# Patient Record
Sex: Male | Born: 1965 | Race: Black or African American | Hispanic: No | Marital: Married | Smoking: Current every day smoker
Health system: Southern US, Community
[De-identification: ages and names within clinical notes are randomized; demographics above are authoritative.]

---

## 2016-02-05 ENCOUNTER — Emergency Department
Admission: EM | Admit: 2016-02-05 | Discharge: 2016-02-05 | Disposition: A | Discharge status: Home / Self Care | Payer: Managed Care, Other (non HMO) | Attending: Emergency Medicine | Admitting: Emergency Medicine

## 2016-02-05 ENCOUNTER — Emergency Department: Payer: Managed Care, Other (non HMO)

## 2016-02-05 DIAGNOSIS — J069 Acute upper respiratory infection, unspecified: Secondary | ICD-10-CM | POA: Diagnosis not present

## 2016-02-05 DIAGNOSIS — J029 Acute pharyngitis, unspecified: Secondary | ICD-10-CM | POA: Diagnosis present

## 2016-02-05 LAB — RAPID INFLUENZA A&B ANTIGENS: Influenza B (ARMC): NEGATIVE

## 2016-02-05 LAB — RAPID INFLUENZA A&B ANTIGENS (ARMC ONLY): INFLUENZA A (ARMC): NEGATIVE

## 2016-02-05 MED ORDER — AZITHROMYCIN 250 MG PO TABS: ORAL_TABLET | ORAL | Status: DC

## 2016-02-05 MED ORDER — IBUPROFEN 800 MG PO TABS
800.0000 mg | ORAL_TABLET | Freq: Once | ORAL | Status: AC
  Administered 2016-02-05: 800 mg via ORAL
  Filled 2016-02-05: qty 1

## 2016-02-05 MED ORDER — AZITHROMYCIN 500 MG PO TABS
500.0000 mg | ORAL_TABLET | Freq: Once | ORAL | Status: AC
  Administered 2016-02-05: 500 mg via ORAL
  Filled 2016-02-05: qty 1

## 2016-02-05 MED ORDER — GUAIFENESIN-CODEINE 100-10 MG/5ML PO SOLN
10.0000 mL | Freq: Once | ORAL | Status: AC
  Administered 2016-02-05: 10 mL via ORAL
  Filled 2016-02-05: qty 10

## 2016-02-05 MED ORDER — HYDROCODONE-ACETAMINOPHEN 5-325 MG PO TABS
2.0000 | ORAL_TABLET | Freq: Once | ORAL | Status: AC
Start: 2016-02-05 — End: 2016-02-05
  Administered 2016-02-05: 2 via ORAL
  Filled 2016-02-05: qty 2

## 2016-02-05 MED ORDER — IBUPROFEN 800 MG PO TABS: 800.0000 mg | ORAL_TABLET | Freq: Three times a day (TID) | ORAL | Status: AC | PRN

## 2016-02-05 MED ORDER — GUAIFENESIN-CODEINE 100-10 MG/5ML PO SOLN: 10.0000 mL | ORAL | Status: DC | PRN

## 2016-02-05 NOTE — ED Notes (Signed)
Pt states started with vomiting and diarrhea on Thursday and thought he had a 24 hr bug, and it progressed into cough and sore throat. Pt states has been taking Mucinex with no relief.

## 2016-02-05 NOTE — Discharge Instructions (Signed)
Upper Respiratory Infection, Adult Most upper respiratory infections (URIs) are a viral infection of the air passages leading to the lungs. A URI affects the nose, throat, and upper air passages. The most common type of URI is nasopharyngitis and is typically referred to as "the common cold." URIs run their course and usually go away on their own. Most of the time, a URI does not require medical attention, but sometimes a bacterial infection in the upper airways can follow a viral infection. This is called a secondary infection. Sinus and middle ear infections are common types of secondary upper respiratory infections. Bacterial pneumonia can also complicate a URI. A URI can worsen asthma and chronic obstructive pulmonary disease (COPD). Sometimes, these complications can require emergency medical care and may be life threatening.  CAUSES Almost all URIs are caused by viruses. A virus is a type of germ and can spread from one person to another.  RISKS FACTORS You may be at risk for a URI if:   You smoke.   You have chronic heart or lung disease.  You have a weakened defense (immune) system.   You are very young or very old.   You have nasal allergies or asthma.  You work in crowded or poorly ventilated areas.  You work in health care facilities or schools. SIGNS AND SYMPTOMS  Symptoms typically develop 2-3 days after you come in contact with a cold virus. Most viral URIs last 7-10 days. However, viral URIs from the influenza virus (flu virus) can last 14-18 days and are typically more severe. Symptoms may include:   Runny or stuffy (congested) nose.   Sneezing.   Cough.   Sore throat.   Headache.   Fatigue.   Fever.   Loss of appetite.   Pain in your forehead, behind your eyes, and over your cheekbones (sinus pain).  Muscle aches.  DIAGNOSIS  Your health care provider may diagnose a URI by:  Physical exam.  Tests to check that your symptoms are not due to  another condition such as:  Strep throat.  Sinusitis.  Pneumonia.  Asthma. TREATMENT  A URI goes away on its own with time. It cannot be cured with medicines, but medicines may be prescribed or recommended to relieve symptoms. Medicines may help:  Reduce your fever.  Reduce your cough.  Relieve nasal congestion. HOME CARE INSTRUCTIONS   Take medicines only as directed by your health care provider.   Gargle warm saltwater or take cough drops to comfort your throat as directed by your health care provider.  Use a warm mist humidifier or inhale steam from a shower to increase air moisture. This may make it easier to breathe.  Drink enough fluid to keep your urine clear or pale yellow.   Eat soups and other clear broths and maintain good nutrition.   Rest as needed.   Return to work when your temperature has returned to normal or as your health care provider advises. You may need to stay home longer to avoid infecting others. You can also use a face mask and careful hand washing to prevent spread of the virus.  Increase the usage of your inhaler if you have asthma.   Do not use any tobacco products, including cigarettes, chewing tobacco, or electronic cigarettes. If you need help quitting, ask your health care provider. PREVENTION  The best way to protect yourself from getting a cold is to practice good hygiene.   Avoid oral or hand contact with people with cold   symptoms.   Wash your hands often if contact occurs.  There is no clear evidence that vitamin C, vitamin E, echinacea, or exercise reduces the chance of developing a cold. However, it is always recommended to get plenty of rest, exercise, and practice good nutrition.  SEEK MEDICAL CARE IF:   You are getting worse rather than better.   Your symptoms are not controlled by medicine.   You have chills.  You have worsening shortness of breath.  You have brown or red mucus.  You have yellow or brown nasal  discharge.  You have pain in your face, especially when you bend forward.  You have a fever.  You have swollen neck glands.  You have pain while swallowing.  You have white areas in the back of your throat. SEEK IMMEDIATE MEDICAL CARE IF:   You have severe or persistent:  Headache.  Ear pain.  Sinus pain.  Chest pain.  You have chronic lung disease and any of the following:  Wheezing.  Prolonged cough.  Coughing up blood.  A change in your usual mucus.  You have a stiff neck.  You have changes in your:  Vision.  Hearing.  Thinking.  Mood. MAKE SURE YOU:   Understand these instructions.  Will watch your condition.  Will get help right away if you are not doing well or get worse.   This information is not intended to replace advice given to you by your health care provider. Make sure you discuss any questions you have with your health care provider.   Document Released: 05/15/2001 Document Revised: 04/05/2015 Document Reviewed: 02/24/2014 Elsevier Interactive Patient Education 2016 [employer]  

## 2016-02-05 NOTE — ED Provider Notes (Signed)
[facility] Emergency Department Provider Note  ____________________________________________  Time seen: Approximately 6:50 PM  I have reviewed the triage vital signs and the nursing notes.   HISTORY  Chief Complaint Cough and Sore Throat    HPI Albert Reese is a 50 y.o. male presents for evaluation of cough congestion sore throat drainage body aches. Patient states started off with vomiting diarrhea last week and then progressed into the current symptoms. States he took Motrin prior to arrival. Has been having a fever on and off. Progressive changes. Nothing seems to make it better or worse this tried over-the-counter medications with no relief.   No past medical history on file.  There are no active problems to display for this patient.   No past surgical history on file.  Current Outpatient Rx  Name  Route  Sig  Dispense  Refill  . azithromycin (ZITHROMAX Z-PAK) 250 MG tablet      Take 2 tablets (500 mg) on  Day 1,  followed by 1 tablet (250 mg) once daily on Days 2 through 5.   6 each   0   . guaiFENesin-codeine 100-10 MG/5ML syrup   Oral   Take 10 mLs by mouth every 4 (four) hours as needed for cough.   180 mL   0   . ibuprofen (ADVIL,MOTRIN) 800 MG tablet   Oral   Take 1 tablet (800 mg total) by mouth every 8 (eight) hours as needed.   30 tablet   0     Allergies Review of patient's allergies indicates no known allergies.  No family history on file.  Social History Social History  Substance Use Topics  . Smoking status: Not on file  . Smokeless tobacco: Not on file  . Alcohol Use: Not on file    Review of Systems Constitutional: Positive fever chills Eyes: No visual changes. ENT: Positive sore throat. Positive nasal congestion. Cardiovascular: Denies chest pain. Respiratory: Denies shortness of breath. Musculoskeletal: Positive for generalized muscle aches. Skin: Negative for rash. Neurological: Negative for headaches,  focal weakness or numbness.  10-point ROS otherwise negative.  ____________________________________________   PHYSICAL EXAM:  VITAL SIGNS: ED Triage Vitals  Enc Vitals Group     BP 02/05/16 1755 144/68 mmHg     Pulse Rate 02/05/16 1755 70     Resp 02/05/16 1755 18     Temp 02/05/16 1755 98.4 F (36.9 C)     Temp src --      SpO2 02/05/16 1755 97 %     Weight 02/05/16 1755 190 lb (86.183 kg)     Height 02/05/16 1755 5\' 9"  (1.753 m)     Head Cir --      Peak Flow --      Pain Score 02/05/16 1756 7     Pain Loc --      Pain Edu? --      Excl. in GC? --     Constitutional: Alert and oriented. Well appearing and in no acute distress. Head: Atraumatic. Nose: Positive congestion/rhinnorhea. Mouth/Throat: Mucous membranes are moist.  Oropharynx non-erythematous. Neck: No stridor.  Full range of motion nontender Cardiovascular: Normal rate, regular rhythm. Grossly normal heart sounds.  Good peripheral circulation. Respiratory: Normal respiratory effort.  No retractions. Lungs CTAB. No active wheezing rhonchi noted Musculoskeletal: No lower extremity tenderness nor edema.  No joint effusions. Neurologic:  Normal speech and language. No gross focal neurologic deficits are appreciated. No gait instability. Skin:  Skin is warm, dry and  intact. No rash noted. Psychiatric: Mood and affect are normal. Speech and behavior are normal.  ____________________________________________   LABS (all labs ordered are listed, but only abnormal results are displayed)  Labs Reviewed  RAPID INFLUENZA A&B ANTIGENS ([facility] ONLY)   ____________________________________________   RADIOLOGY  No acute cardiopulmonary findings ____________________________________________   PROCEDURES  Procedure(s) performed: None  Critical Care performed: No  ____________________________________________   INITIAL IMPRESSION / ASSESSMENT AND PLAN / ED COURSE  Pertinent labs & imaging results that were  available during my care of the patient were reviewed by me and considered in my medical decision making (see chart for details).  Acute upper respiratory infection. Rx given for Zithromax, Robitussin-AC, Motrin 800 mg 3 times a day. Patient follow-up with his PCP or return to the ER with any worsening symptomology. Work excuse 48 hours given. ____________________________________________   FINAL CLINICAL IMPRESSION(S) / ED DIAGNOSES  Final diagnoses:  URI, acute     This chart was dictated using voice recognition software/Dragon. Despite best efforts to proofread, errors can occur which can change the meaning. Any change was purely unintentional.   Evangeline Dakin, PA-C 02/05/16 2033  Phineas Semen, MD 02/07/16 [phone]

## 2016-02-05 NOTE — ED Notes (Signed)
Pt reports 2-3 days of n/v/d. Pt reports that has resolved and now his throat hurts and he has a cough.

## 2016-04-24 ENCOUNTER — Encounter: Payer: Self-pay | Admitting: *Deleted

## 2016-04-24 ENCOUNTER — Emergency Department
Admission: EM | Admit: 2016-04-24 | Discharge: 2016-04-24 | Disposition: A | Discharge status: Home / Self Care | Payer: Worker's Compensation | Attending: Emergency Medicine | Admitting: Emergency Medicine

## 2016-04-24 ENCOUNTER — Emergency Department: Payer: Worker's Compensation

## 2016-04-24 DIAGNOSIS — Y9389 Activity, other specified: Secondary | ICD-10-CM | POA: Insufficient documentation

## 2016-04-24 DIAGNOSIS — Y929 Unspecified place or not applicable: Secondary | ICD-10-CM | POA: Insufficient documentation

## 2016-04-24 DIAGNOSIS — Y999 Unspecified external cause status: Secondary | ICD-10-CM | POA: Insufficient documentation

## 2016-04-24 DIAGNOSIS — R42 Dizziness and giddiness: Secondary | ICD-10-CM | POA: Insufficient documentation

## 2016-04-24 DIAGNOSIS — M50321 Other cervical disc degeneration at C4-C5 level: Secondary | ICD-10-CM | POA: Insufficient documentation

## 2016-04-24 DIAGNOSIS — F17210 Nicotine dependence, cigarettes, uncomplicated: Secondary | ICD-10-CM | POA: Insufficient documentation

## 2016-04-24 DIAGNOSIS — M5090 Cervical disc disorder, unspecified, unspecified cervical region: Secondary | ICD-10-CM

## 2016-04-24 DIAGNOSIS — M5031 Other cervical disc degeneration,  high cervical region: Secondary | ICD-10-CM | POA: Insufficient documentation

## 2016-04-24 MED ORDER — PREDNISONE 10 MG PO TABS: 50.0000 mg | ORAL_TABLET | Freq: Every day | ORAL | Status: AC

## 2016-04-24 MED ORDER — HYDROCODONE-ACETAMINOPHEN 5-325 MG PO TABS: 1.0000 | ORAL_TABLET | ORAL | Status: AC | PRN

## 2016-04-24 MED ORDER — BACLOFEN 10 MG PO TABS: 10.0000 mg | ORAL_TABLET | Freq: Three times a day (TID) | ORAL | Status: AC

## 2016-04-24 NOTE — ED Notes (Signed)
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

## 2016-04-24 NOTE — ED Provider Notes (Signed)
[facility] Emergency Department Provider Note  ____________________________________________  Time seen: Approximately 10:19 AM  I have reviewed the triage vital signs and the nursing notes.   HISTORY  Chief Complaint Fall    HPI Albert Reese is a 50 y.o. male presents for evaluation of continued cervical pain mild head dizziness and left arm weakness secondary to fall off a tractor last week. Patient states he was seen in the emergency room on Saturday which was 3 days ago with neck pain or arm pain had radiological imaging done unknown which type and sent home. Patient states that he still continues to have left arm weakness and numbness.   History reviewed. No pertinent past medical history.  There are no active problems to display for this patient.   History reviewed. No pertinent past surgical history.  Current Outpatient Rx  Name  Route  Sig  Dispense  Refill  . baclofen (LIORESAL) 10 MG tablet   Oral   Take 1 tablet (10 mg total) by mouth 3 (three) times daily.   30 tablet   0   . HYDROcodone-acetaminophen (NORCO) 5-325 MG tablet   Oral   Take 1-2 tablets by mouth every 4 (four) hours as needed for moderate pain.   15 tablet   0   . ibuprofen (ADVIL,MOTRIN) 800 MG tablet   Oral   Take 1 tablet (800 mg total) by mouth every 8 (eight) hours as needed.   30 tablet   0   . predniSONE (DELTASONE) 10 MG tablet   Oral   Take 5 tablets (50 mg total) by mouth daily with breakfast.   25 tablet   0     Allergies Review of patient's allergies indicates no known allergies.  No family history on file.  Social History Social History  Substance Use Topics  . Smoking status: Current Every Day Smoker -- 0.50 packs/day    Types: Cigarettes  . Smokeless tobacco: None  . Alcohol Use: No    Review of Systems Constitutional: No fever/chills Eyes: No visual changes. ENT: No sore throat. Cardiovascular: Denies chest pain. Respiratory:  Denies shortness of breath. Musculoskeletal: Positive for cervical pain, left shoulder and arm weakness. Skin: Negative for rash. Neurological: Positive headache and left arm numbness  10-point ROS otherwise negative.  ____________________________________________   PHYSICAL EXAM:  VITAL SIGNS: ED Triage Vitals  Enc Vitals Group     BP 04/24/16 0958 136/81 mmHg     Pulse Rate 04/24/16 0958 71     Resp 04/24/16 0958 20     Temp 04/24/16 0958 97.7 F (36.5 C)     Temp Source 04/24/16 0958 Oral     SpO2 04/24/16 0958 98 %     Weight 04/24/16 0958 190 lb (86.183 kg)     Height 04/24/16 0958 5\' 9"  (1.753 m)     Head Cir --      Peak Flow --      Pain Score 04/24/16 0959 8     Pain Loc --      Pain Edu? --      Excl. in GC? --     Constitutional: Alert and oriented. Well appearing and in no acute distress. Mouth/Throat: Mucous membranes are moist.  Oropharynx non-erythematous. Neck: No stridor.  Full range of motion with point tenderness noted to the C5-7 area more paraspinally. Cardiovascular: Normal rate, regular rhythm. Grossly normal heart sounds.  Good peripheral circulation. Respiratory: Normal respiratory effort.  No retractions. Lungs CTAB. Musculoskeletal: No  lower extremity tenderness nor edema.  No joint effusions. Her 6 extremities with good extension and abduction and abduction. Strength intact equally bilaterally. Distal neurovascular reflexes as well as pulses. Neurologic:  Normal speech and language. No gross focal neurologic deficits are appreciated. No gait instability. Skin:  Skin is warm, dry and intact. No rash noted. Psychiatric: Mood and affect are normal. Speech and behavior are normal.  ____________________________________________   LABS (all labs ordered are listed, but only abnormal results are displayed)  Labs Reviewed - No data to display ____________________________________________   RADIOLOGY  IMPRESSION: 1. No significant intracranial  abnormality observed. 2. Cervical spondylosis and degenerative disc disease causing moderate impingement at C4-5 and potentially mild impingement at C3-4, as detailed above. ____________________________________________   PROCEDURES  Procedure(s) performed: None  Critical Care performed: No  ____________________________________________   INITIAL IMPRESSION / ASSESSMENT AND PLAN / ED COURSE  Pertinent labs & imaging results that were available during my care of the patient were reviewed by me and considered in my medical decision making (see chart for details).  Degenerative disc diseases cervical with possible impingement noted. She was given referral to [facility] in [place] Rx given for baclofen 10 mg 3 times a day and prednisone 5 day taper. Patient follow-up with PCP or return to ER with any worsening symptomology. ____________________________________________   FINAL CLINICAL IMPRESSION(S) / ED DIAGNOSES  Final diagnoses:  Cervical back pain with evidence of disc disease     This chart was dictated using voice recognition software/Dragon. Despite best efforts to proofread, errors can occur which can change the meaning. Any change was purely unintentional.   Evangeline Dakin, PA-C 04/24/16 1220  Jene Every, MD 04/24/16 [phone]

## 2016-04-24 NOTE — ED Notes (Signed)
See triage note  States he is still having some dizziness and some numbness to left arm and neck

## 2017-06-20 IMAGING — CT CT CERVICAL SPINE W/O CM
4 of 5 series · 14 of 33 positions shown, 16 images · non-contrast
Comparison: None.

CLINICAL DATA: Dizziness. Numbness to the left arm and neck. Fall
from tractor 3 days ago.

EXAM:
CT HEAD WITHOUT CONTRAST
CT CERVICAL SPINE WITHOUT CONTRAST
TECHNIQUE: Multidetector CT imaging of the head and cervical spine was
performed following the standard protocol without intravenous
contrast. Multiplanar CT image reconstructions of the cervical spine
were also generated.

[Series 5: soft tissue · axial · 0.33mm/px · z∈[+380,+418]mm · 2 of 98 slices shown]
[im 20/98  soft-tissue]
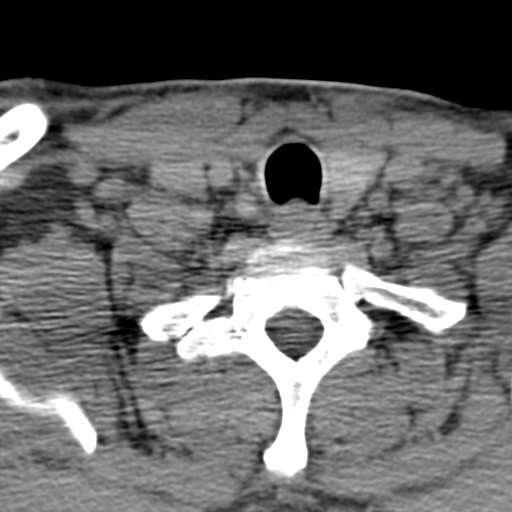
[im 39/98  soft-tissue]
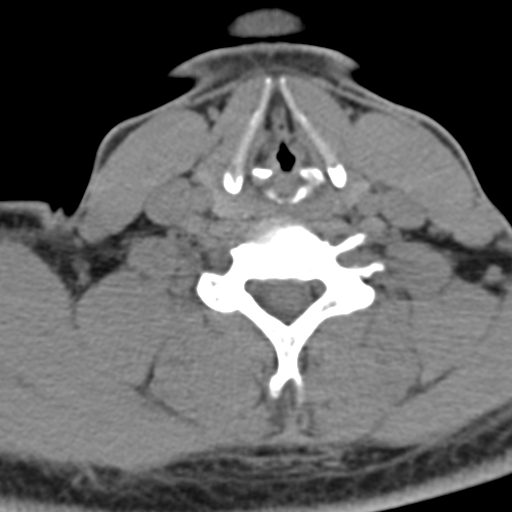

[Series 6: sagittal bone · sagittal · 0.30mm/px · 5 of 52 slices shown, 6 images]
[im 18/52  bone]
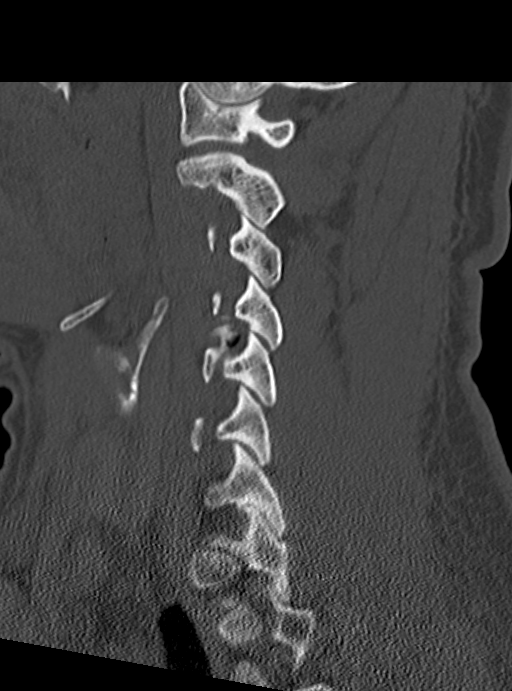
[im 22/52  bone]
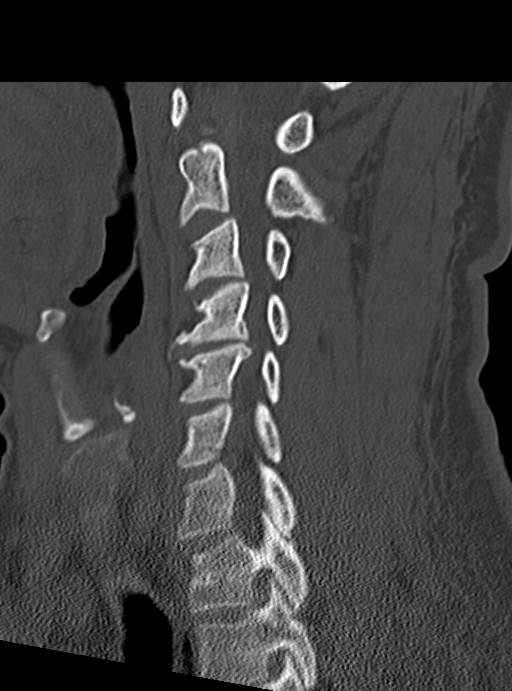
[im 26/52  soft-tissue]
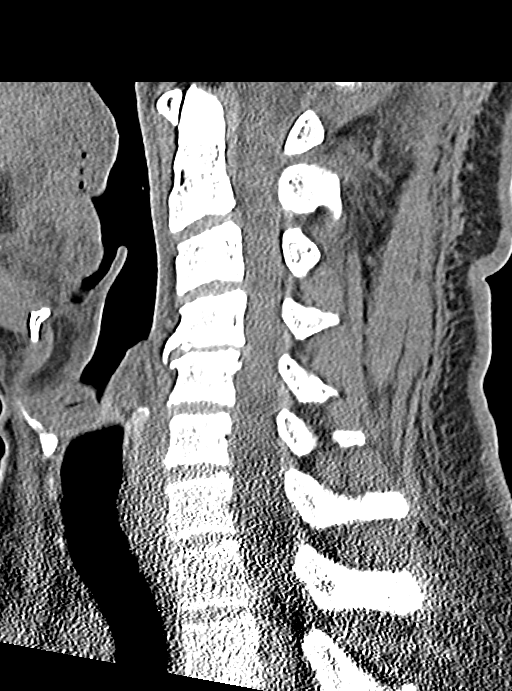
[im 26/52  bone]
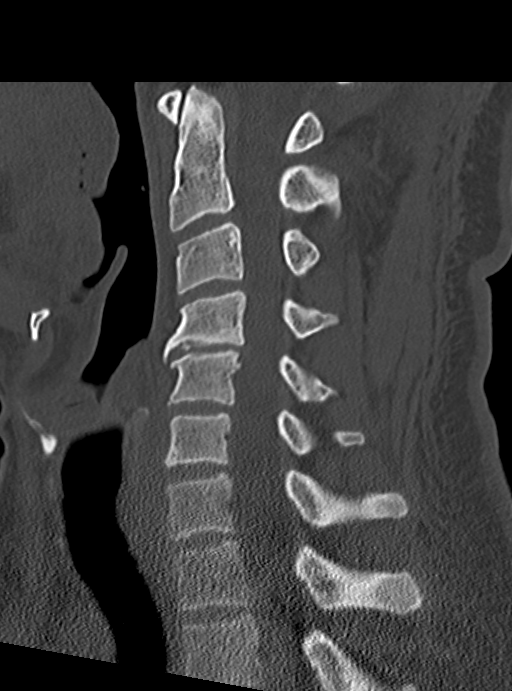
[im 30/52  bone]
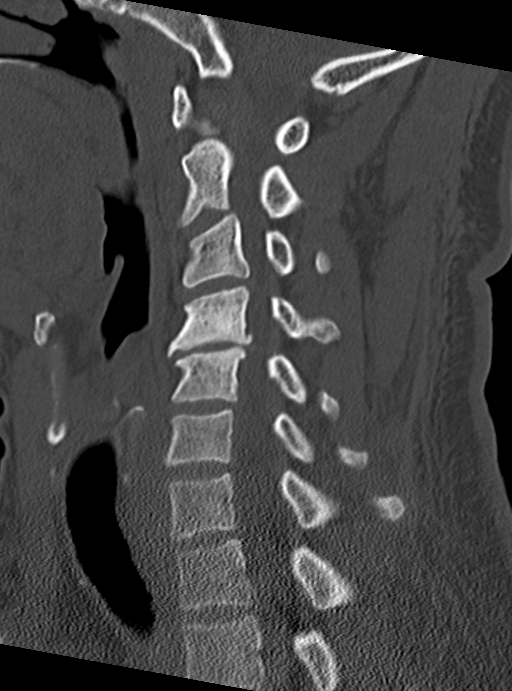
[im 35/52  bone]
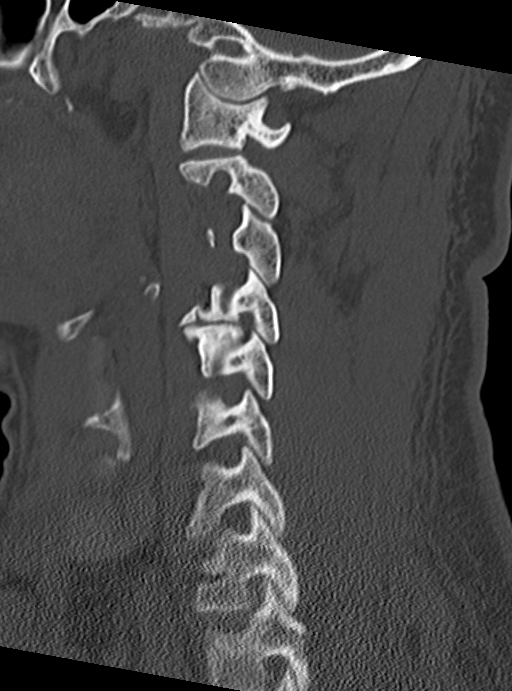

[Series 7: coronal bone · coronal · 0.24mm/px · 3 of 67 slices shown]
[im 14/67  bone]
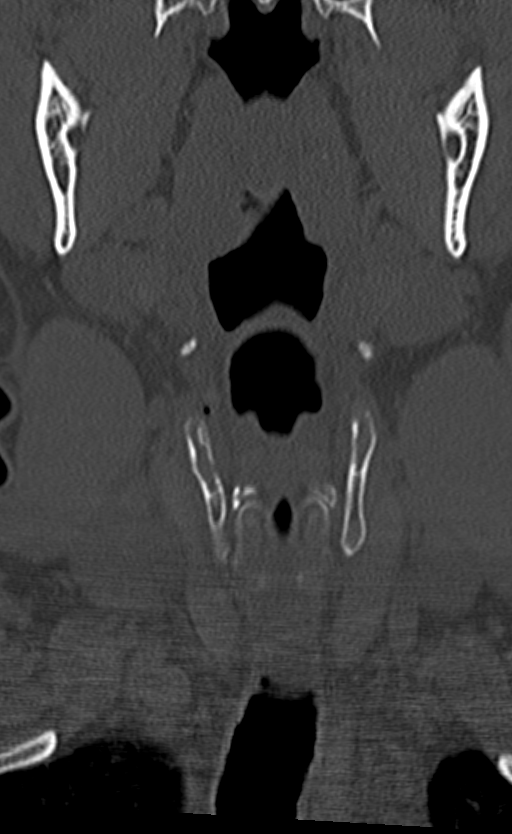
[im 27/67  bone]
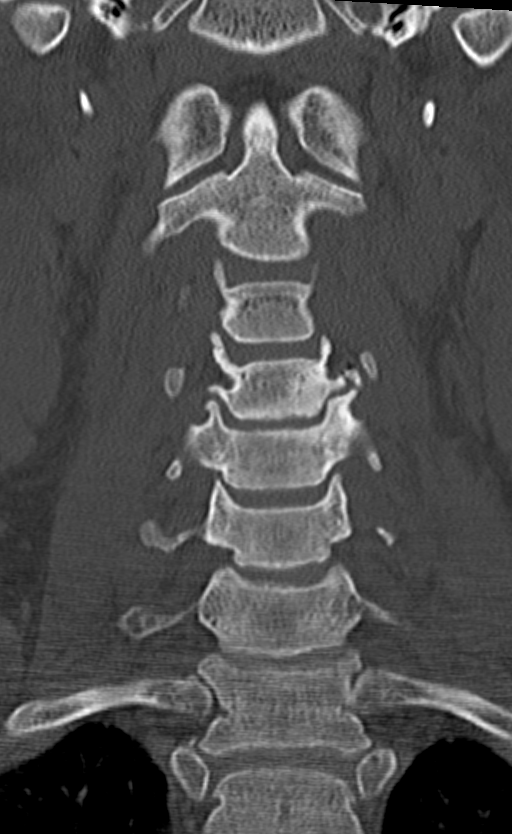
[im 40/67  bone]
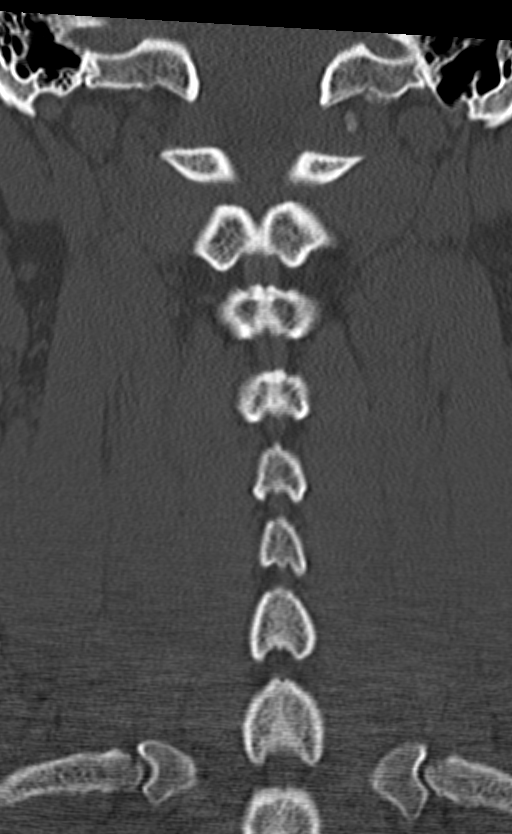

[Series 8: axial · axial · 0.26mm/px · z∈[+366,+476]mm · 4 of 97 slices shown, 5 images]
[im 20/97  soft-tissue]
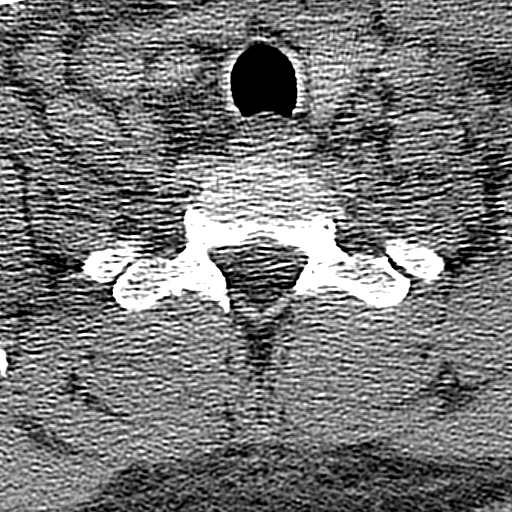
[im 20/97  bone]
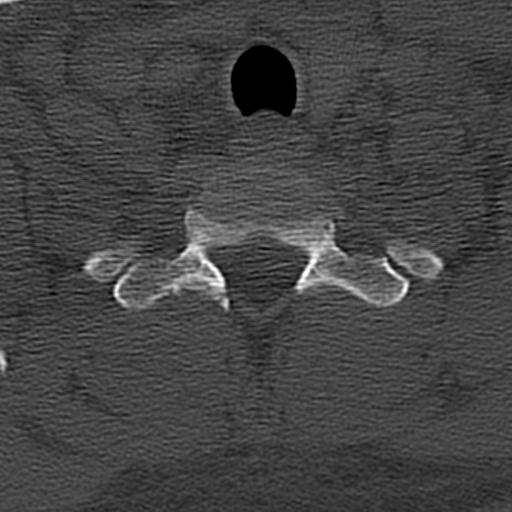
[im 39/97  bone]
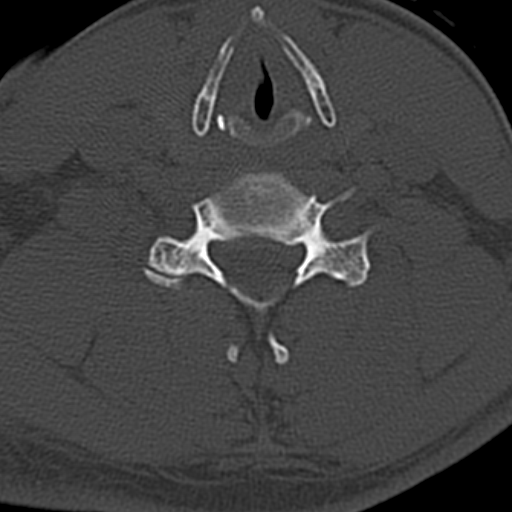
[im 58/97  bone]
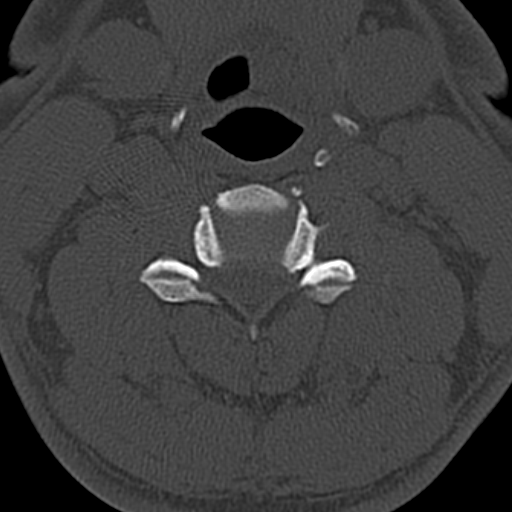
[im 77/97  bone]
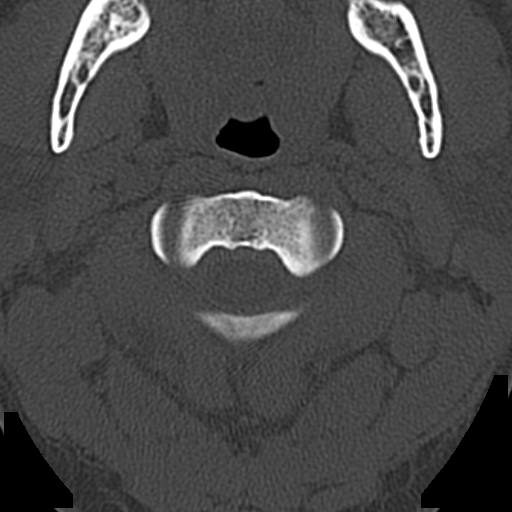

[14 of 33 positions shown; findings below may reference images not displayed]

FINDINGS: CT HEAD FINDINGS

The brainstem, cerebellum, cerebral peduncles, thalami, basal
ganglia, basilar cisterns, and ventricular system appear within
normal limits. No intracranial hemorrhage, mass lesion, or acute
CVA.

CT CERVICAL SPINE FINDINGS

No cervical spine fracture or subluxation identified.

Moderate central narrowing of the thecal sac and mild to moderate
bilateral foraminal stenosis at C4-5 due to disc bulge, posterior
osseous ridging, and uncinate spurring. Possible mild central
narrowing of the thecal sac at C3-4 due to disc bulge.
IMPRESSION: 1. No significant intracranial abnormality observed.
2. Cervical spondylosis and degenerative disc disease causing
moderate impingement at C4-5 and potentially mild impingement at
C3-4, as detailed above.
